# Patient Record
Sex: Male | Born: 1950 | Race: White | Hispanic: No | Marital: Married | Smoking: Current some day smoker
Health system: Southern US, Academic
[De-identification: ages and names within clinical notes are randomized; demographics above are authoritative.]

## PROBLEM LIST (undated history)

## (undated) DIAGNOSIS — E785 Hyperlipidemia, unspecified: Secondary | ICD-10-CM

## (undated) DIAGNOSIS — I2510 Atherosclerotic heart disease of native coronary artery without angina pectoris: Secondary | ICD-10-CM

## (undated) DIAGNOSIS — I10 Essential (primary) hypertension: Secondary | ICD-10-CM

## (undated) HISTORY — DX: Essential (primary) hypertension: I10

## (undated) HISTORY — PX: HX CORONARY ARTERY BYPASS GRAFT: SHX141

## (undated) HISTORY — DX: Atherosclerotic heart disease of native coronary artery without angina pectoris: I25.10

## (undated) HISTORY — PX: HX BACK SURGERY: SHX140

## (undated) HISTORY — DX: Hyperlipidemia, unspecified: E78.5

## (undated) HISTORY — PX: CARDIAC CATHETERIZATION: SHX172

---

## 2019-11-14 ENCOUNTER — Inpatient Hospital Stay (HOSPITAL_COMMUNITY): Payer: Self-pay | Admitting: Internal Medicine

## 2020-11-21 IMAGING — MR MRI CERVICAL SPINE WITHOUT CONTRAST
6 series · 28 of 48 positions shown · IV contrast (gadolinium)
Comparison: None available.

﻿EXAM:  MRI CERVICAL SPINE WITHOUT CONTRAST
INDICATION: Neck pain with left upper extremity numbness.
TECHNIQUE: Multiplanar multisequential MRI of the cervical spine was performed without gadolinium contrast.

[Series 4: s-map · sagittal · 8.8mm · 4.38mm/px · 8 of 100 slices shown]
[im 4/100]
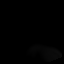
[im 16/100]
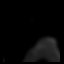
[im 32/100]
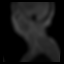
[im 44/100]
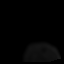
[im 56/100]
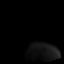
[im 68/100]
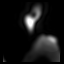
[im 84/100]
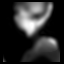
[im 96/100]
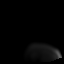

[Series 5: T2 · sagittal · 3.0mm · 0.81mm/px · 4 of 15 slices shown (1 of 2)]
[im 1/15]
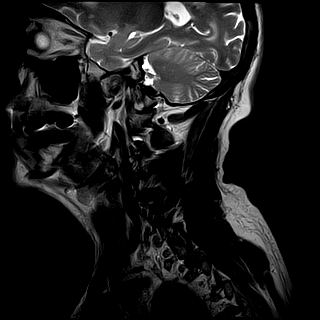
[im 5/15]
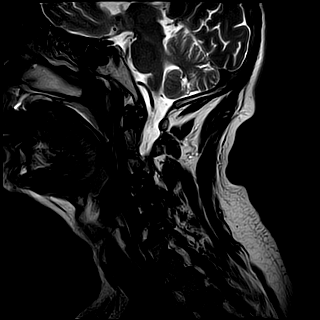
[im 10/15]
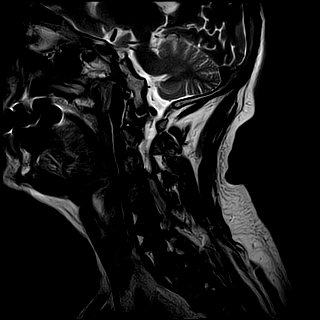
[im 15/15]
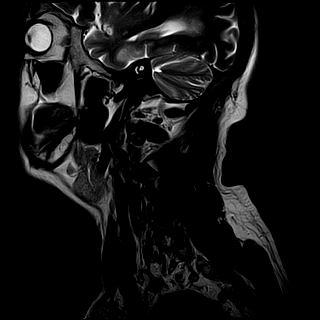

[Series 6: T1 · sagittal · 3.0mm · 0.47mm/px · 4 of 15 slices shown]
[im 1/15]
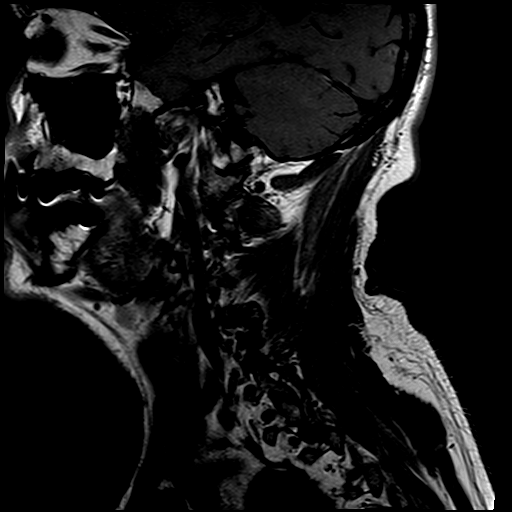
[im 5/15]
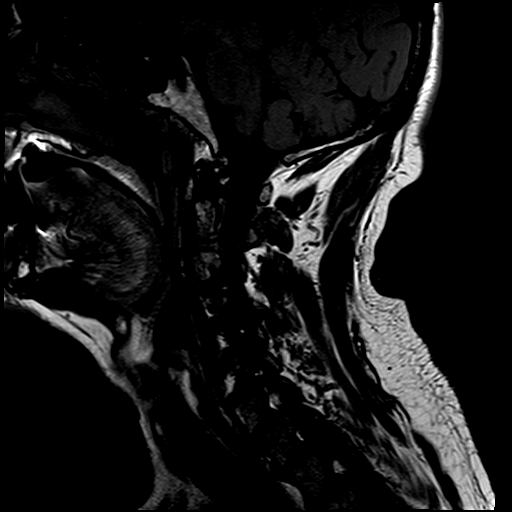
[im 10/15]
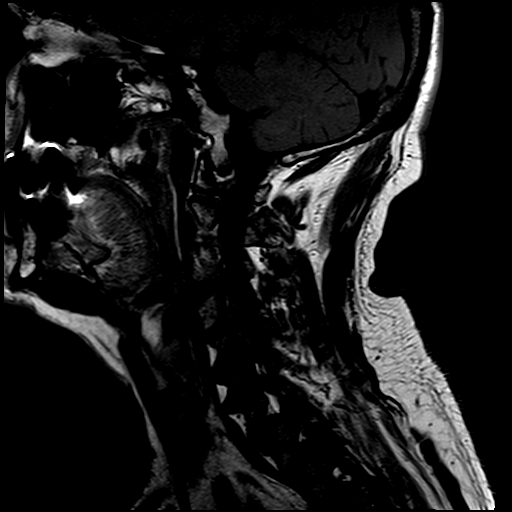
[im 15/15]
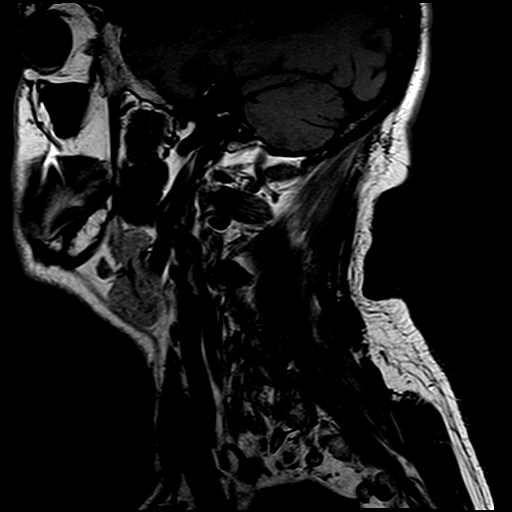

[Series 7: STIR · sagittal · 3.0mm · 0.51mm/px · 4 of 15 slices shown]
[im 1/15]
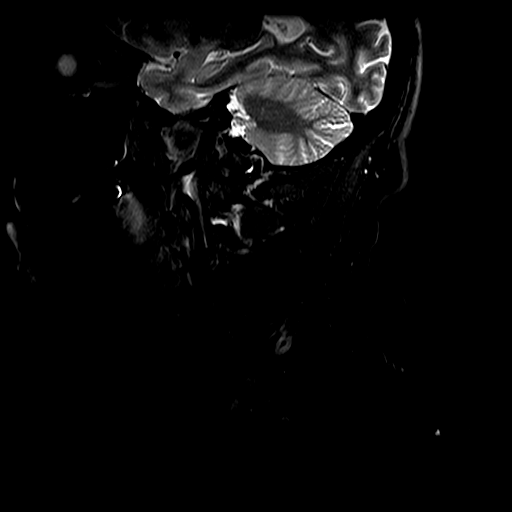
[im 5/15]
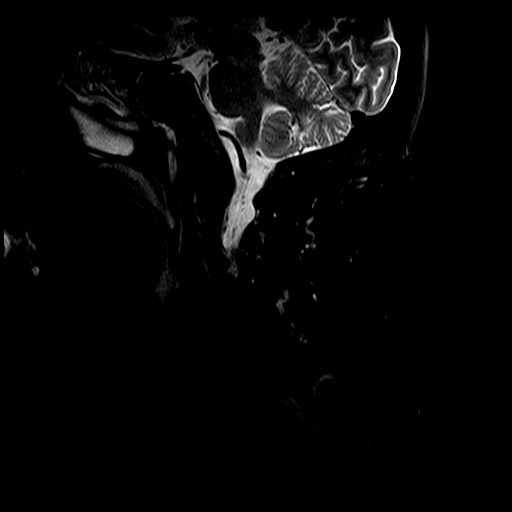
[im 10/15]
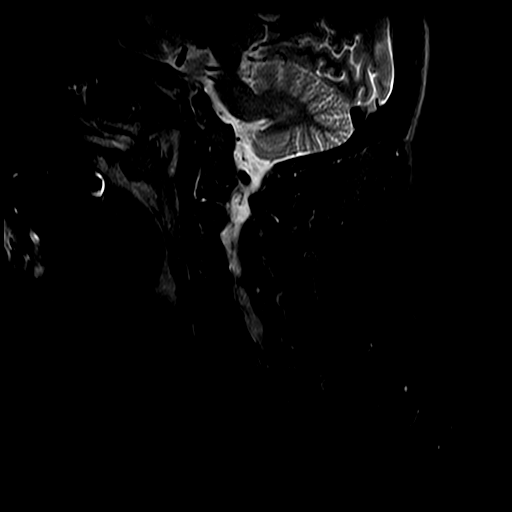
[im 15/15]
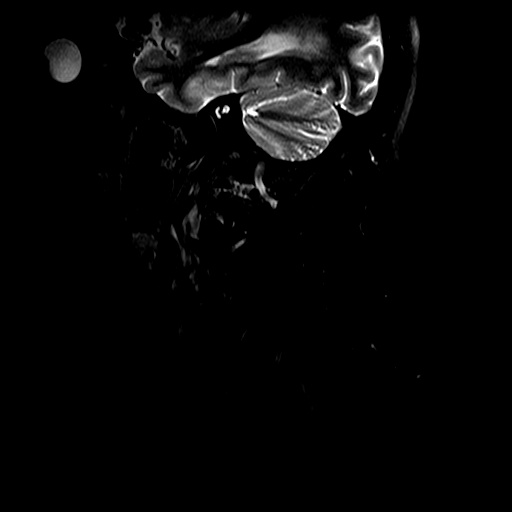

[Series 8: T2-star · axial · 3.0mm · 0.39mm/px · z∈[-127,-81]mm · 3 of 18 slices shown]
[im 1/18]
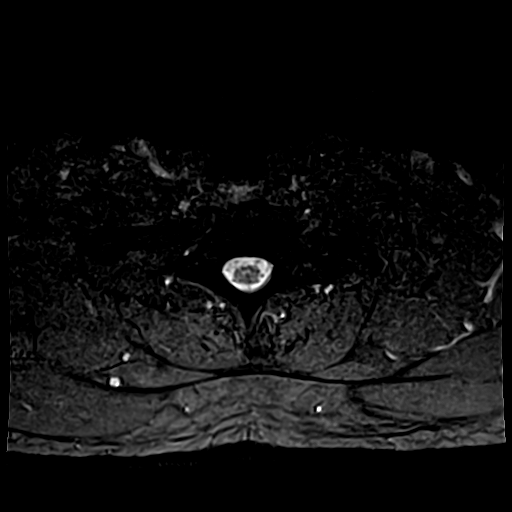
[im 5/18]
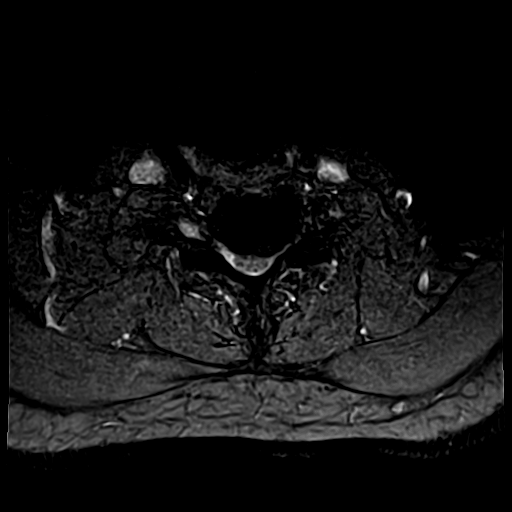
[im 9/18]
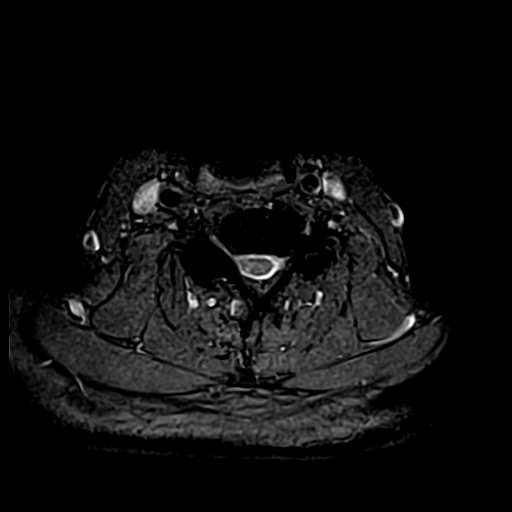

[Series 9: T2 · axial · 3.0mm · 0.39mm/px · z∈[-127,-17]mm · 5 of 17 slices shown (2 of 2)]
[im 1/17]
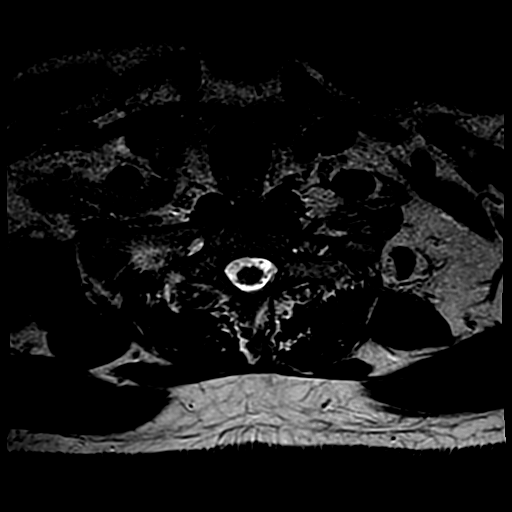
[im 5/17]
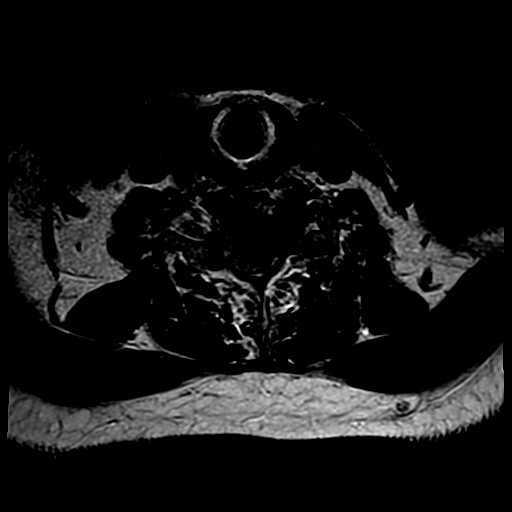
[im 9/17]
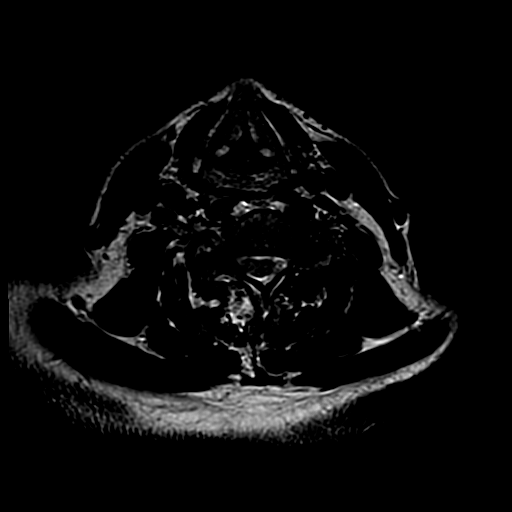
[im 13/17]
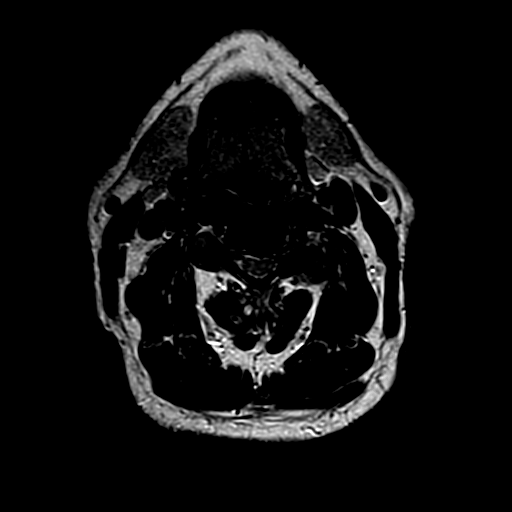
[im 17/17]
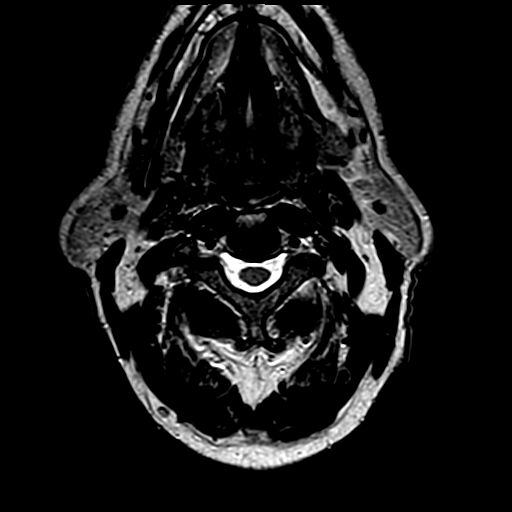

[28 of 48 positions shown; findings below may reference images not displayed]

FINDINGS: Bone marrow signal intensity is normal. There is no acute fracture or subluxation. Visualized spinal cord is normal in signal intensity without evidence of compression at any level.

C2-3 level is unremarkable.

At C3-4 level, there is a small broad-based central disc bulge resulting in mild spinal stenosis. There is severe bilateral neural foraminal stenosis from facet and uncovertebral joint hypertrophy.

At C4-5 level, there is minimal anterolisthesis of C4 on C5 vertebral body. There is a small broad-based central disc bulge with near complete effacement of the ventral CSF. There is severe bilateral neural foraminal stenosis from facet and uncovertebral joint hypertrophy.

At C5-6 level, there is a small broad-based central disc bulge with near complete effacement of the ventral CSF. There is severe bilateral neural foraminal stenosis from facet and uncovertebral joint hypertrophy.

At C6-7 level, there is moderate to marked disc desiccation. There is a small broad-based central disc osteophyte complex with near complete effacement of the ventral CSF. There is severe left and moderate right neural foraminal stenosis from facet and uncovertebral joint hypertrophy.

C7-T1 level and paraspinal soft tissues are unremarkable.
IMPRESSION: 1. Minimal anterolisthesis of C4 on C5 vertebral body. 

2. Small disc osteophyte complexes at most levels with mild spinal stenosis at C3-4 level. 

3. Multilevel neural foraminal stenosis as detailed above.

## 2020-11-28 IMAGING — MR MRI BRAIN WITHOUT CONTRAST
8 of 9 series · 37 of 48 positions shown · non-contrast
Comparison: None available.

﻿EXAM:  MRI BRAIN WITHOUT CONTRAST
INDICATION: Left-sided weakness. History of meningitis.
TECHNIQUE: Noncontrast multiplanar multisequential MRI of the brain was performed.

[Series 5: DWI · axial · 5.0mm · 1.35mm/px · z∈[-15,+105]mm · 10 of 88 slices shown (1 of 3)]
[im 6/88]
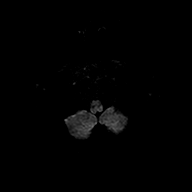
[im 12/88]
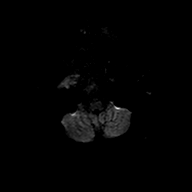
[im 18/88]
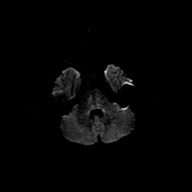
[im 30/88]
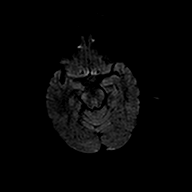
[im 41/88]
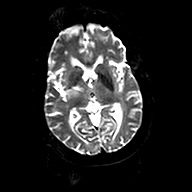
[im 47/88]
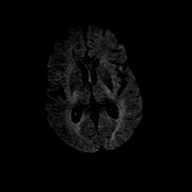
[im 53/88]
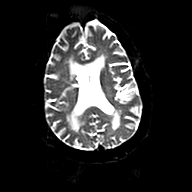
[im 64/88]
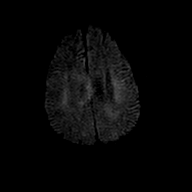
[im 76/88]
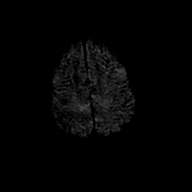
[im 88/88]
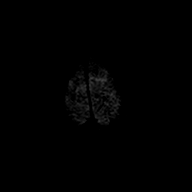

[Series 6: DWI · axial · 5.0mm · 1.35mm/px · z∈[-21,+105]mm · 5 of 22 slices shown (2 of 3)]
[im 1/22]
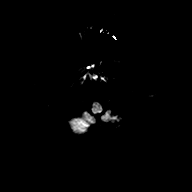
[im 6/22]
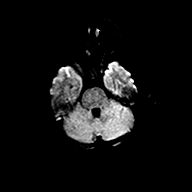
[im 11/22]
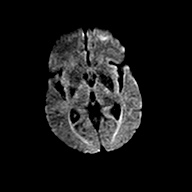
[im 16/22]
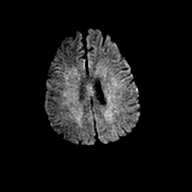
[im 22/22]
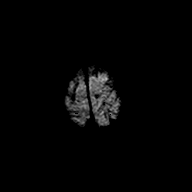

[Series 7: DWI · axial · 5.0mm · 1.35mm/px · z∈[-21,+105]mm · 3 of 21 slices shown (3 of 3)]
[im 1/21]
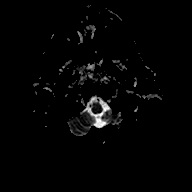
[im 11/21]
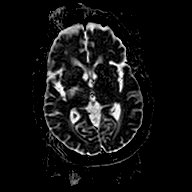
[im 21/21]
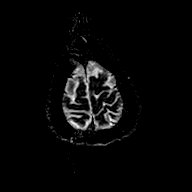

[Series 8: FLAIR · sagittal · 4.0mm · 0.75mm/px · 4 of 26 slices shown (1 of 2)]
[im 1/26]
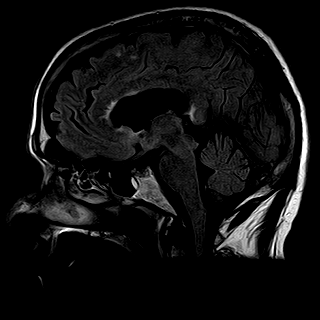
[im 9/26]
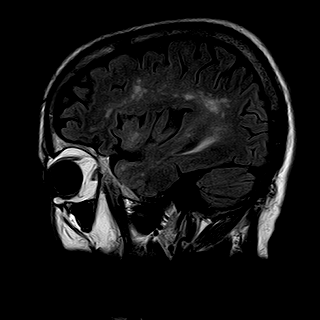
[im 17/26]
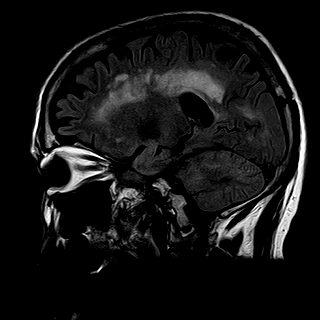
[im 26/26]
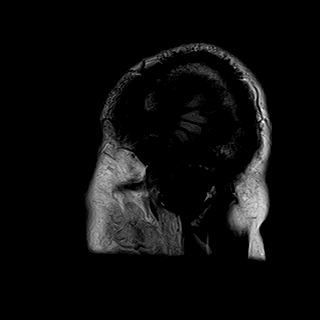

[Series 9: T2 · axial · 5.0mm · 0.43mm/px · z∈[-35,+121]mm · 4 of 27 slices shown (1 of 2)]
[im 1/27]
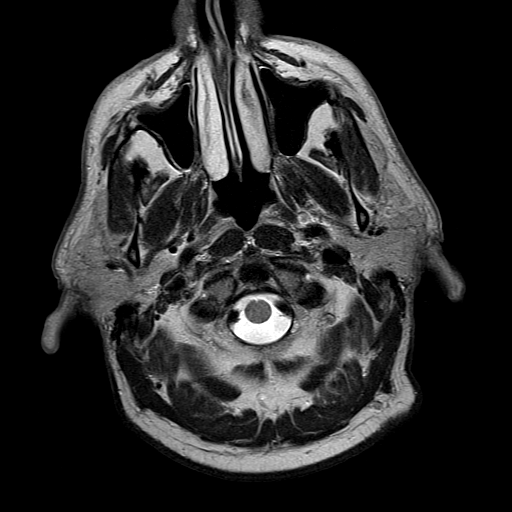
[im 9/27]
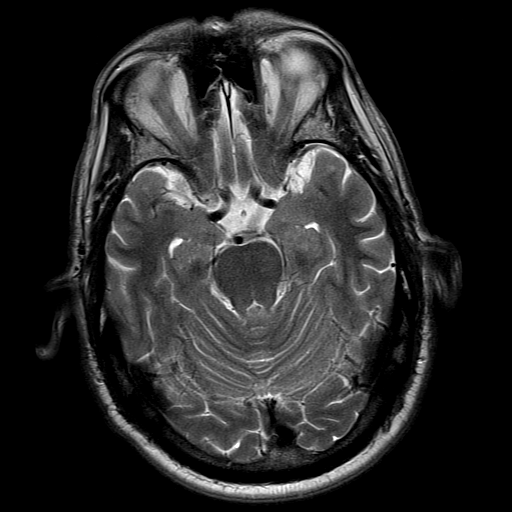
[im 18/27]
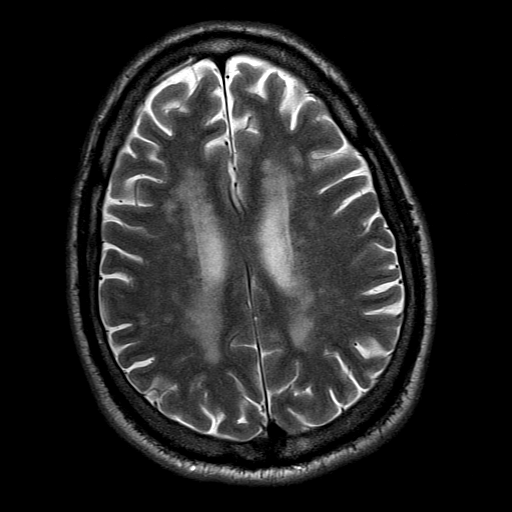
[im 27/27]
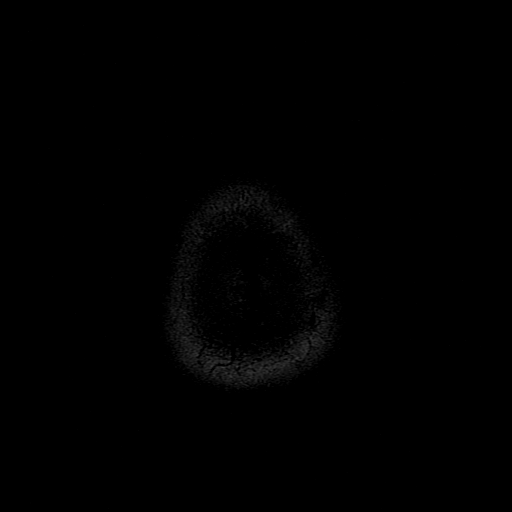

[Series 10: FLAIR · axial · 5.0mm · 0.43mm/px · z∈[-35,+121]mm · 4 of 27 slices shown (2 of 2)]
[im 1/27]
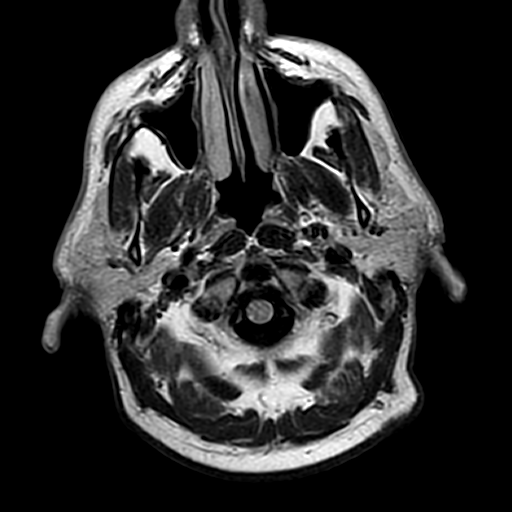
[im 9/27]
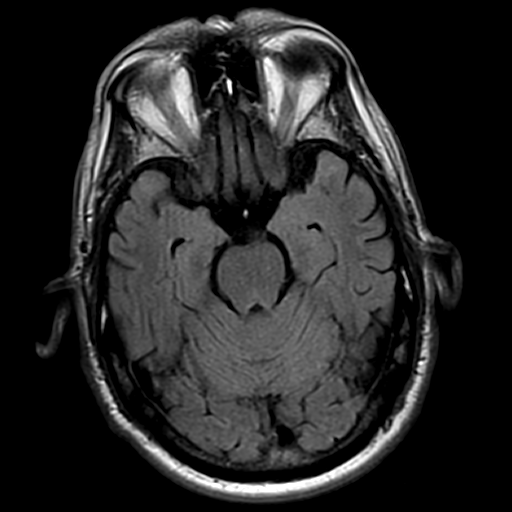
[im 18/27]
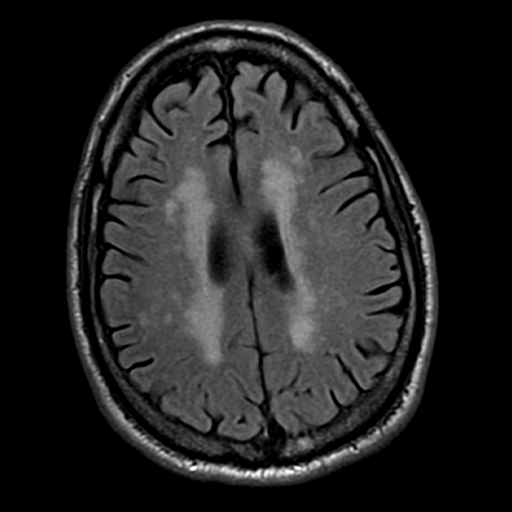
[im 27/27]
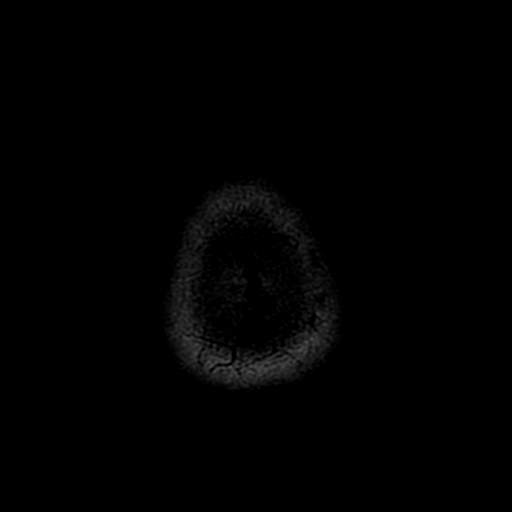

[Series 11: T2 · coronal · 6.0mm · 0.43mm/px · 4 of 24 slices shown (2 of 2)]
[im 1/24]
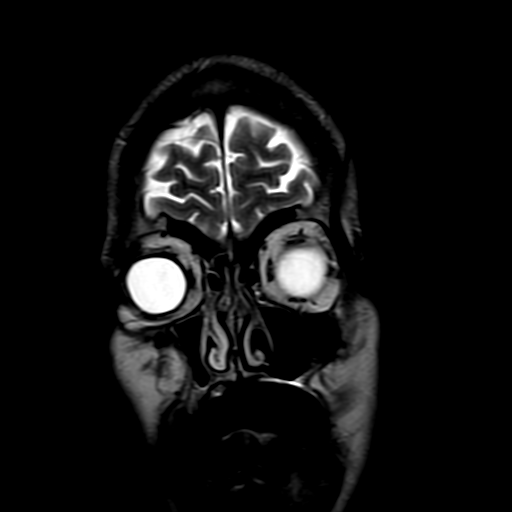
[im 8/24]
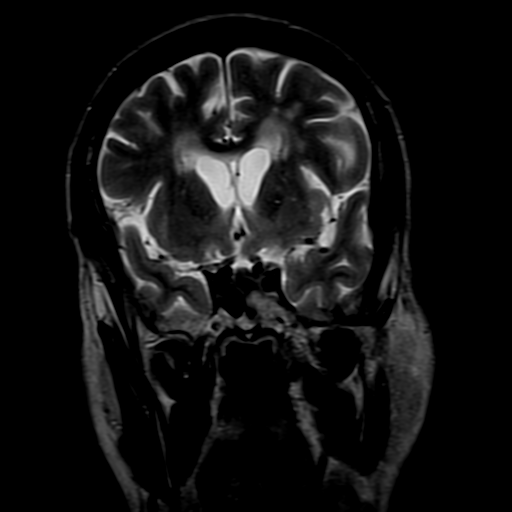
[im 16/24]
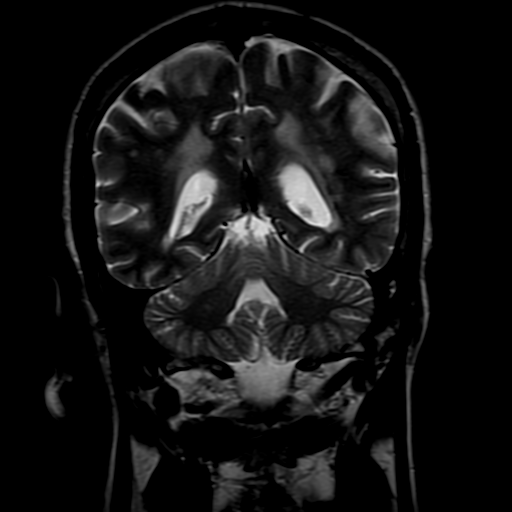
[im 24/24]
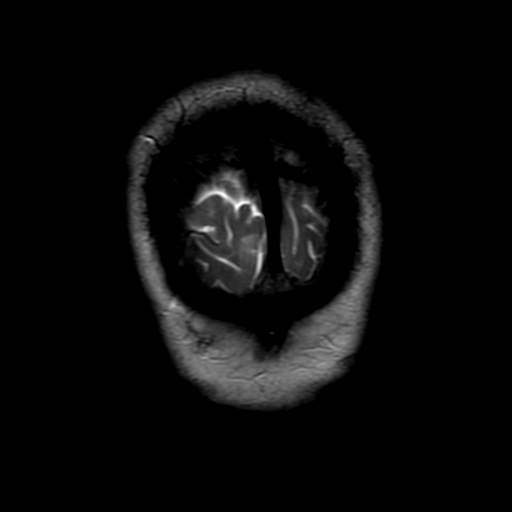

[Series 13: T1 · axial · 5.0mm · 0.43mm/px · z∈[-35,+67]mm · 3 of 27 slices shown]
[im 1/27]
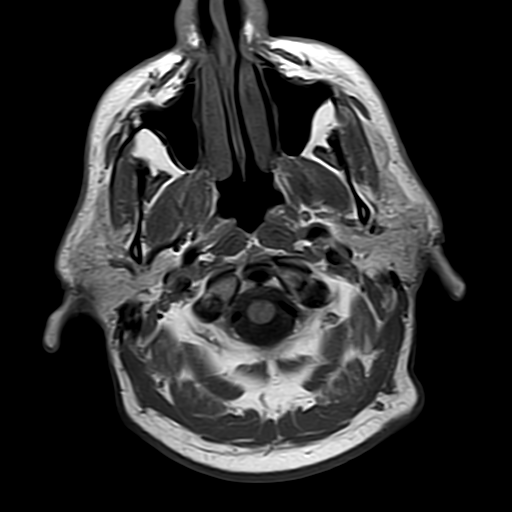
[im 9/27]
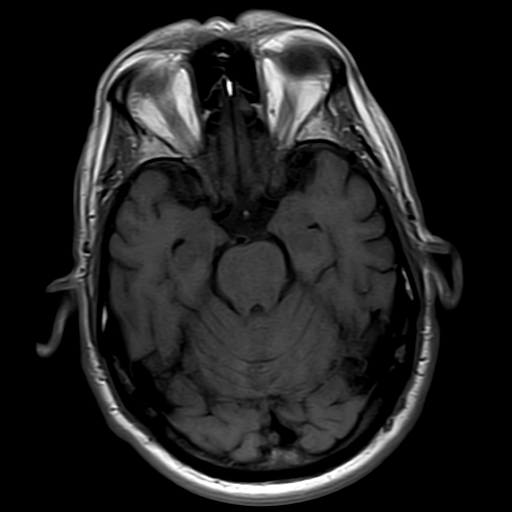
[im 18/27]
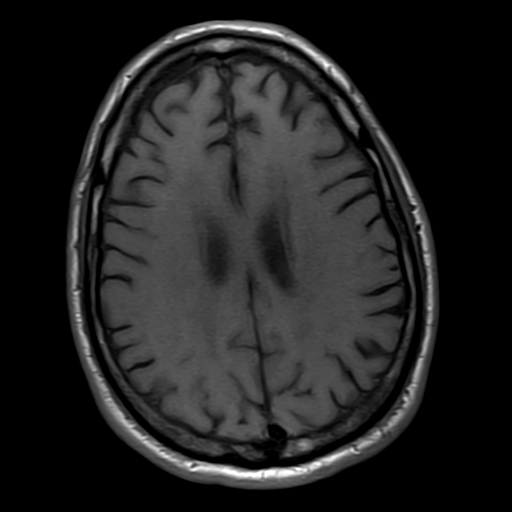

[37 of 48 positions shown; findings below may reference images not displayed]

FINDINGS: Ventricular and sulcal size is normal for the patient's age. There are moderate chronic small vessel ischemic changes. There is no mass effect, midline shift or intracranial hemorrhage. There is no evidence of acute infarction or prior microhemorrhages. Skull base flow voids and basal cisterns are patent. Sagittal survey of midline structures is unremarkable. There are no extra-axial fluid collections. Visualized paranasal sinuses, mastoid air cells and orbital contents are unremarkable.
IMPRESSION: Moderate chronic small vessel ischemic changes, no acute intracranial abnormality.

## 2020-11-28 IMAGING — MR MRI LUMBAR SPINE W/CONTRAST
9 series · 48 of 48 positions shown · IV contrast (gadavist)
Comparison: None available.

﻿EXAM:  MRI LUMBAR SPINE W/CONTRAST
INDICATION: Lower back pain with left lower extremity weakness. History of remote back surgery.
TECHNIQUE: Multiplanar multisequential MRI of the lumbar spine was performed without and with 5 mL of Gadavist.

[Series 11: T2 · sagittal · 4.0mm · 0.94mm/px · 5 of 13 slices shown (1 of 3)]
[im 1/13]
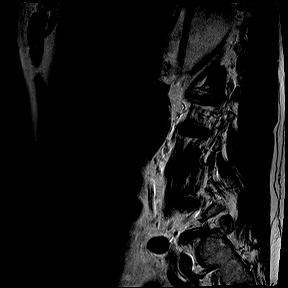
[im 4/13]
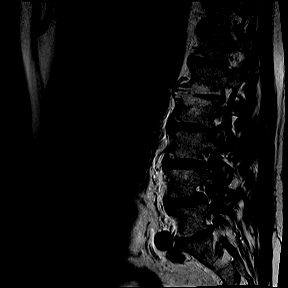
[im 7/13]
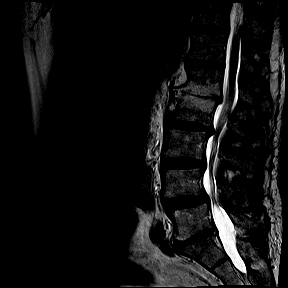
[im 10/13]
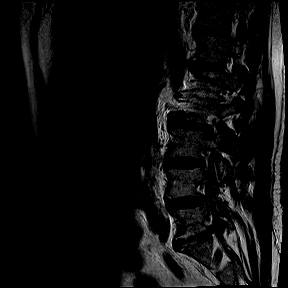
[im 13/13]
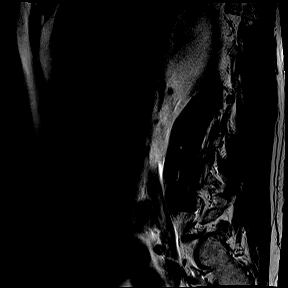

[Series 12: T1 · sagittal · 4.0mm · 0.94mm/px · 4 of 13 slices shown]
[im 1/13]
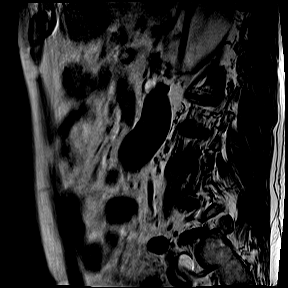
[im 5/13]
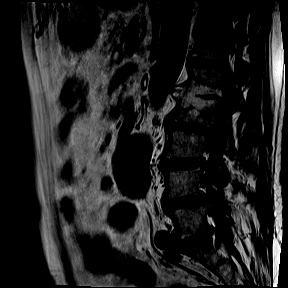
[im 9/13]
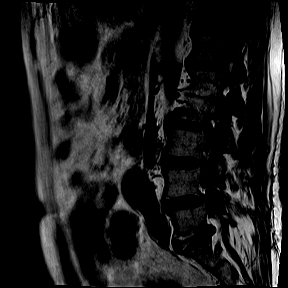
[im 13/13]
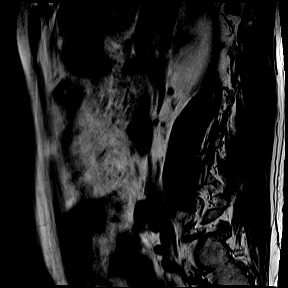

[Series 14: T1 fat-sat · sagittal · 4.0mm · 1.05mm/px · 4 of 13 slices shown (1 of 2)]
[im 1/13]
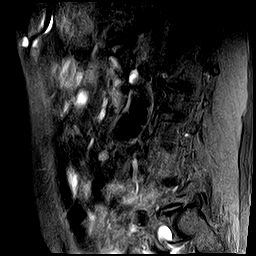
[im 5/13]
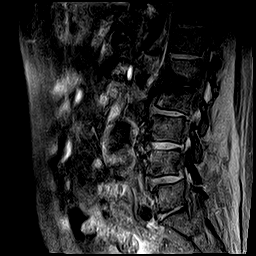
[im 9/13]
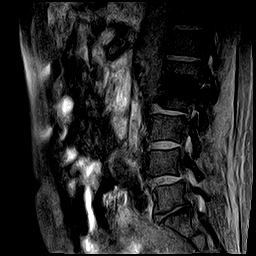
[im 13/13]
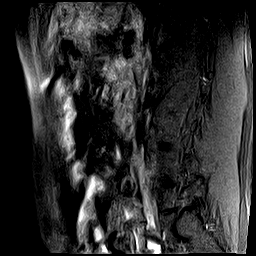

[Series 15: STIR · sagittal · 4.0mm · 1.05mm/px · 4 of 13 slices shown]
[im 1/13]
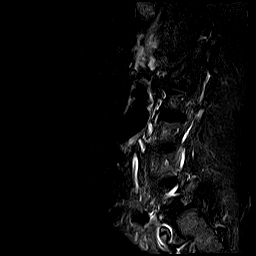
[im 5/13]
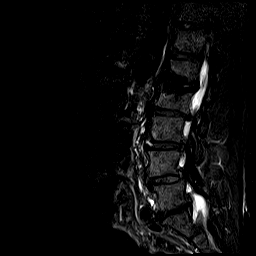
[im 9/13]
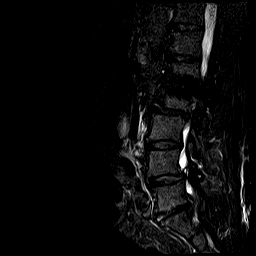
[im 13/13]
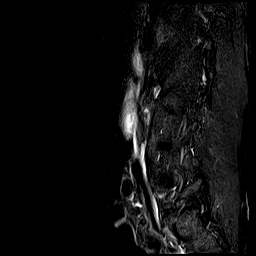

[Series 16: T2 · axial · 4.0mm · 0.47mm/px · z∈[-117,+65]mm · 7 of 23 slices shown (2 of 3)]
[im 1/23]
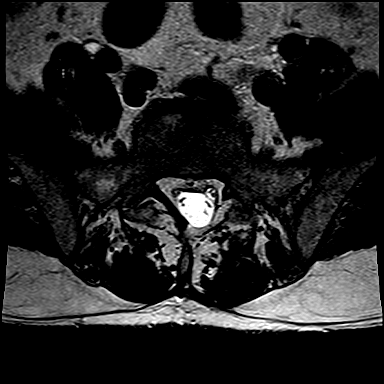
[im 4/23]
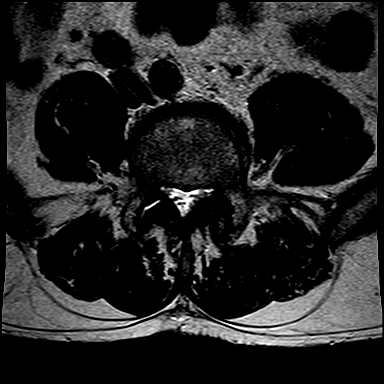
[im 8/23]
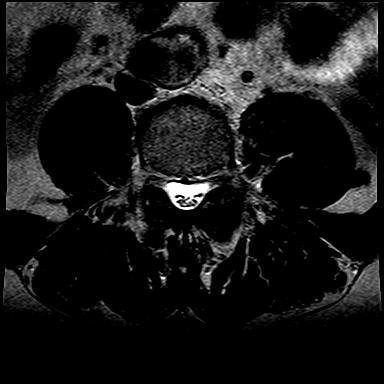
[im 12/23]
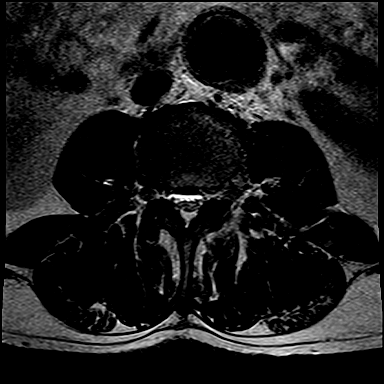
[im 15/23]
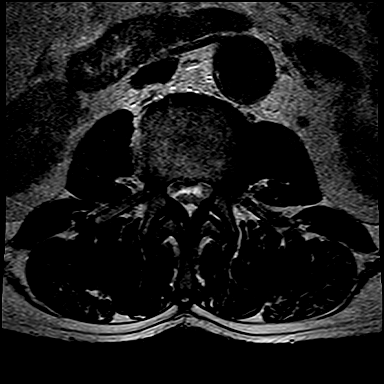
[im 19/23]
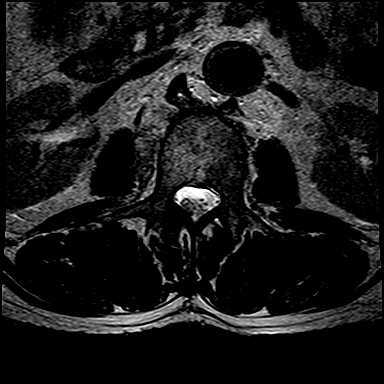
[im 23/23]
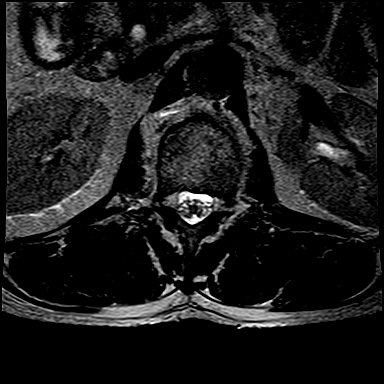

[Series 17: T1 fat-sat · axial · 4.0mm · 0.70mm/px · z∈[-117,+65]mm · 7 of 23 slices shown (2 of 2)]
[im 1/23]
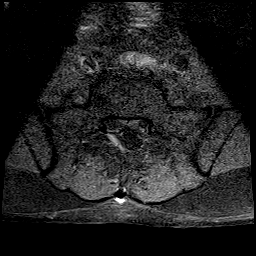
[im 4/23]
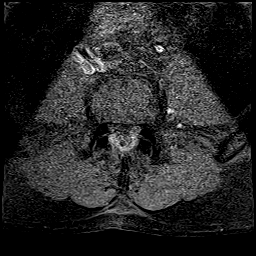
[im 8/23]
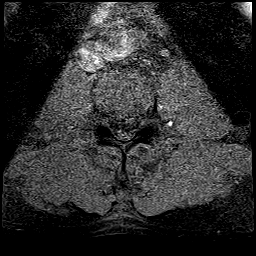
[im 12/23]
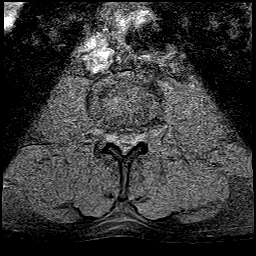
[im 15/23]
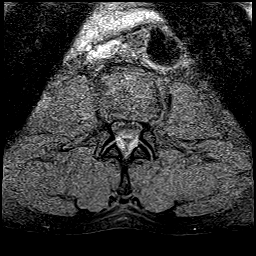
[im 19/23]
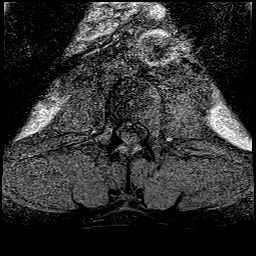
[im 23/23]
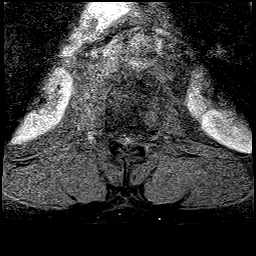

[Series 18: T1 fat-sat post-contrast · sagittal · 4.0mm · 1.05mm/px · 4 of 13 slices shown (1 of 2)]
[im 1/13]
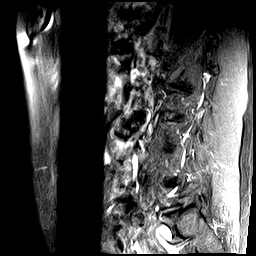
[im 5/13]
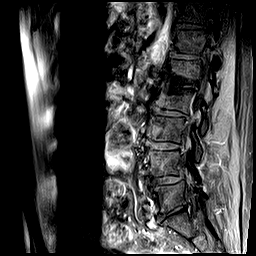
[im 9/13]
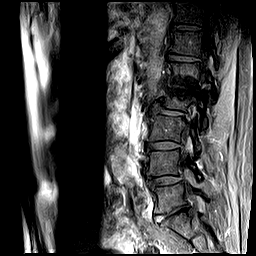
[im 13/13]
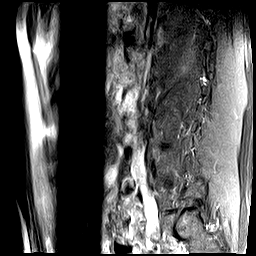

[Series 19: T1 fat-sat post-contrast · axial · 4.0mm · 0.70mm/px · z∈[-117,+65]mm · 7 of 23 slices shown (2 of 2)]
[im 1/23]
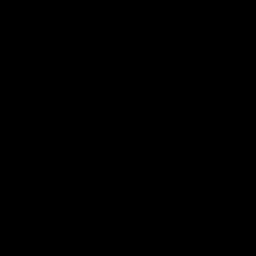
[im 4/23]
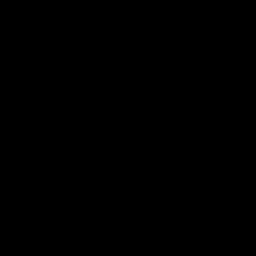
[im 8/23]
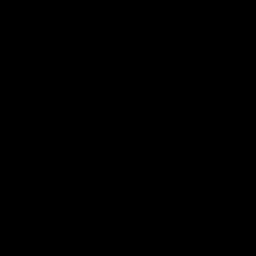
[im 12/23]
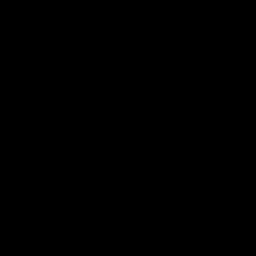
[im 15/23]
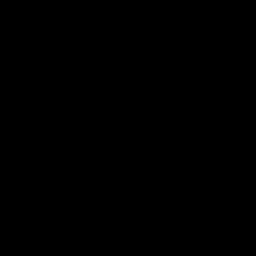
[im 19/23]
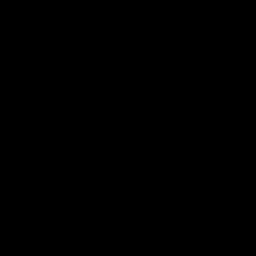
[im 23/23]
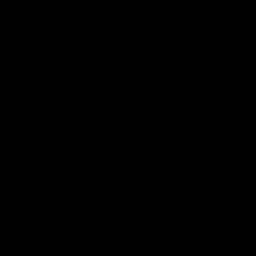

[Series 20: T2 · coronal · 4.0mm · 1.22mm/px · 6 of 20 slices shown (3 of 3)]
[im 1/20]
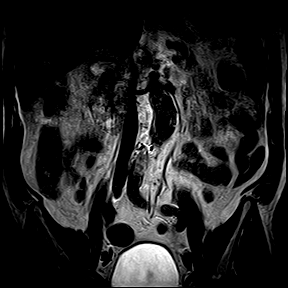
[im 4/20]
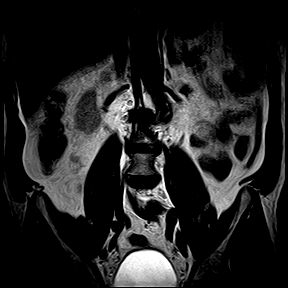
[im 8/20]
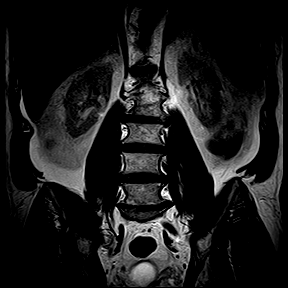
[im 12/20]
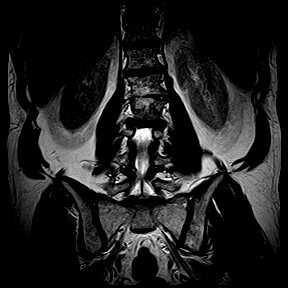
[im 16/20]
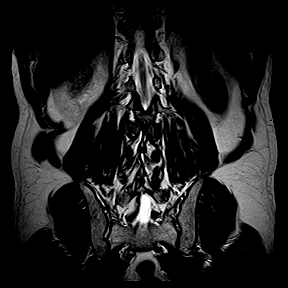
[im 20/20]
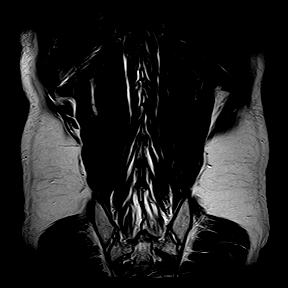

[48 of 48 positions shown; findings below may reference images not displayed]

FINDINGS: Bone marrow signal intensity is normal. There is no acute fracture or subluxation. Distal spinal cord is normal in signal intensity and terminates normally at T12-L1 disc space level. Spinal canal is congenitally narrow.

At L1-2 level, there is marked disc desiccation. Modic type 2 changes are also noted within the inferior endplate of L1 and superior endplate of L2 vertebral body. There is a small broad-based central and right paracentral disc bulge resulting in mild spinal and severe right lateral recess compression. There is mild-to-moderate bilateral neural foraminal stenosis from bulging annulus.

At L2-3 level, there is a small broad-based central disc bulge resulting in severe spinal stenosis. There is moderate bilateral neural foraminal stenosis from facet arthropathy and bulging annulus.

At L3-4 level, there is a small broad-based central disc bulge resulting in moderate spinal stenosis. There is moderate bilateral neural foraminal stenosis from facet arthropathy and bulging annulus.

At L4-5 level, there is minimal anterolisthesis of L4 on L5 vertebral body. There is a small broad-based central disc bulge resulting in moderate to severe spinal stenosis. There is moderate bilateral neural foraminal stenosis from facet arthropathy and bulging annulus.

At L5-S1 level, there is moderate to marked disc desiccation. There is a small broad-based central disc bulge, mildly effacing the ventral thecal sac. There is moderate to severe bilateral neural foraminal stenosis from facet arthropathy and bulging annulus.

Paraspinal soft tissues are unremarkable. Following intravenous contrast administration, there is no abnormal nerve root or paraspinal soft tissue enhancement. A 4.2 cm mid abdominal aortic aneurysm is incidentally identified.
IMPRESSION: 1. Minimal anterolisthesis of L4 on L5 vertebral body. 

2. Mild spinal and severe right lateral recess compression at L1-2 level from a small central and right paracentral disc bulge. 

3. Severe spinal stenosis at L2-3 level, moderate to severe spinal stenosis at L4-5 level and moderate spinal stenosis at L3-4 level from small central disc bulges. 

4. Multilevel neural foraminal stenosis as detailed above. Incidentally noted 4.2 cm mid abdominal aortic aneurysm.

## 2022-09-11 ENCOUNTER — Encounter (INDEPENDENT_AMBULATORY_CARE_PROVIDER_SITE_OTHER): Payer: Self-pay | Admitting: Physician Assistant

## 2022-09-17 ENCOUNTER — Encounter (INDEPENDENT_AMBULATORY_CARE_PROVIDER_SITE_OTHER): Payer: Self-pay | Admitting: CARDIOVASCULAR DISEASE

## 2022-09-18 ENCOUNTER — Other Ambulatory Visit (INDEPENDENT_AMBULATORY_CARE_PROVIDER_SITE_OTHER): Payer: Self-pay | Admitting: CARDIOVASCULAR DISEASE

## 2022-09-18 ENCOUNTER — Other Ambulatory Visit: Payer: Self-pay

## 2022-09-18 ENCOUNTER — Ambulatory Visit (INDEPENDENT_AMBULATORY_CARE_PROVIDER_SITE_OTHER): Payer: Medicare Other | Admitting: CARDIOVASCULAR DISEASE

## 2022-09-18 ENCOUNTER — Encounter (INDEPENDENT_AMBULATORY_CARE_PROVIDER_SITE_OTHER): Payer: Self-pay | Admitting: CARDIOVASCULAR DISEASE

## 2022-09-18 VITALS — BP 130/70 | HR 58 | Ht 69.0 in | Wt 195.3 lb

## 2022-09-18 DIAGNOSIS — E785 Hyperlipidemia, unspecified: Secondary | ICD-10-CM

## 2022-09-18 DIAGNOSIS — I2510 Atherosclerotic heart disease of native coronary artery without angina pectoris: Secondary | ICD-10-CM

## 2022-09-18 DIAGNOSIS — R9431 Abnormal electrocardiogram [ECG] [EKG]: Secondary | ICD-10-CM

## 2022-09-18 DIAGNOSIS — I10 Essential (primary) hypertension: Secondary | ICD-10-CM

## 2022-09-18 DIAGNOSIS — F17200 Nicotine dependence, unspecified, uncomplicated: Secondary | ICD-10-CM

## 2022-09-18 MED ORDER — REPATHA SURECLICK 140 MG/ML SUBCUTANEOUS PEN INJECTOR
1.0000 mL | PEN_INJECTOR | SUBCUTANEOUS | 3 refills | Status: DC
Start: 2022-09-18 — End: 2022-10-16

## 2022-09-18 NOTE — Addendum Note (Signed)
Addended by: Einar Gip on: 09/18/2022 02:02 PM     Modules accepted: Orders

## 2022-09-18 NOTE — Progress Notes (Signed)
CARDIOLOGY [facility]  [address] [place] [address]       Name: Christian Reyes MRN:  [id number]   Date: 09/18/2022 Age: 71 y.o.       Follow up Progress Note  PCP: No primary care provider on file.    HPI:  Tyrail Reyes is a 71 y.o. year old male who presents to the clinic for  coronary artery disease.    CARDIAC HISTORY:  This is a 71 years old patient who has a history of coronary artery bypass surgery in the past.  The last left ventricular ejection fraction was approximately around 35-40% in May of 2019.  The patient has a history of left internal mammary artery bypass graft to the left anterior descending coronary artery, saphenous vein bypass graft to the diagonal branch and a saphenous vein bypass graft to the 2nd diagonal branch and a saphenous vein bypass graft to the 3rd diagonal branch and a saphenous vein bypass graft to the distal right coronary artery.  The patient can not take Lipitor fenofibrate and Crestor.  He was on Repatha which was stopped because of severe pain in both knee joints.  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  He has not taken any Repatha for more than a year because of significant knee pain.  The patient continues to smoke about half a pack of cigarettes a day under drinks on a regular basis.  He has not physically active.    Not on File  Social History     Tobacco Use   Smoking Status Not on file   Smokeless Tobacco Not on file           Current Medical History:  I have comprehensively reviewed the records of their past medical history, family history, social history, along with medication history and they are updated per HPI.    Review of Systems:  General: No fever, No chills  Neck: No pain or swelling  HEENT: No dizziness  Cardiac: As noted in HPI  Pulmonary: As noted in HPI  Abdomen: No pain or change in bowel  GU:  No  hematuria    Physical Exam:   There were no vitals taken for this visit.      General: Normal.  Skin: Normal.  Eyes: Normal.  HENT: Normal.  Respiratory: Normal.  Cardiovascular: Normal  first and second heart sounds there is a grade 1/6 systolic murmur over the aortic area.  GI: Normal.  Extremities:  No edema of the lower extremities.   Musculoskeletal:  Normal.  Neurologic:  Normal.  Psychiatric: Normal.    Wt Readings from Last 3 Encounters:   No data found for Wt       Current Medications:   Current Outpatient Medications   Medication Sig Dispense Refill    aspirin (ECOTRIN) 81 mg Oral Tablet, Delayed Release (E.C.) Take 1 Tablet (81 mg total) by mouth Once a day      carvediloL (COREG) 6.25 mg Oral Tablet Take 1 Tablet (6.25 mg total) by mouth Twice daily with food      evolocumab (REPATHA SURECLICK) 140 mg/mL Subcutaneous Pen Injector Inject under the skin      losartan (COZAAR) 25 mg Oral Tablet Take 1 Tablet (25 mg total) by mouth Once a day      omeprazole (PRILOSEC) 40 mg Oral Capsule, Delayed Release(E.C.)  Take 1 Capsule (40 mg total) by mouth Once a day       No current facility-administered medications for this visit.       Current Labs:   His labs done by the primary physician shows that the LDL cholesterol is not well controlled.  EKGs done preoperatively shows sinus rhythm T-wave inversions in lateral precordial leads and intermittent left bundle branch block pattern.    Orders:  No orders of the defined types were placed in this encounter.    There are no discontinued medications.    ASSESSMENT/PLAN:   No diagnosis found.    1.  Coronary artery disease.  The patient has an abnormal EKG and needs preoperative clearance.  He has not had any stress test after the bypass surgery and he has been noncompliant with cholesterol medication under he continues to smoke.  His EKG shows sinus rhythm and T-wave inversion in lateral precordial leads which may suggest ischemia and also intermittent left  bundle-branch block .  The patient is not physically active.  We will proceed with a nuclear stress test to see if there is any evidence of ischemia.    2.  Hyperlipidemia.  I have sent him a new prescription for Repatha.  I have told him to take it on a regular basis.    3. Persistent tobacco use.  Spoke to the patient at length about tobacco cessation.  Follow-up in 6 months if the stress test is negative.  If the stress test is negative I will clear him as intermediate risk for cardiac complications during the proposed back surgery.    Follow Up:    No follow-ups on file.     Zenaida Deed, MD,09/18/2022,13:20    [facility]  [facility]iology   [facility]    A portion of this documentation may have been generated using [facility] voice recognition software and may contain syntax/voice recognition errors.

## 2022-09-26 ENCOUNTER — Encounter (HOSPITAL_BASED_OUTPATIENT_CLINIC_OR_DEPARTMENT_OTHER): Payer: Medicare Other

## 2022-09-26 DIAGNOSIS — I447 Left bundle-branch block, unspecified: Secondary | ICD-10-CM

## 2022-09-26 DIAGNOSIS — Z01810 Encounter for preprocedural cardiovascular examination: Secondary | ICD-10-CM

## 2022-10-01 ENCOUNTER — Telehealth (INDEPENDENT_AMBULATORY_CARE_PROVIDER_SITE_OTHER): Payer: Self-pay | Admitting: CARDIOVASCULAR DISEASE

## 2022-10-01 NOTE — Telephone Encounter (Signed)
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  He was already cleared by Maddie PA for his back surgery - mild to moderate risk.  Sharon Seller, RN

## 2022-10-16 ENCOUNTER — Telehealth (INDEPENDENT_AMBULATORY_CARE_PROVIDER_SITE_OTHER): Payer: Self-pay | Admitting: CARDIOVASCULAR DISEASE

## 2022-10-16 ENCOUNTER — Other Ambulatory Visit (INDEPENDENT_AMBULATORY_CARE_PROVIDER_SITE_OTHER): Payer: Self-pay | Admitting: CARDIOVASCULAR DISEASE

## 2022-10-16 MED ORDER — REPATHA SURECLICK 140 MG/ML SUBCUTANEOUS PEN INJECTOR: 1.0000 mL | PEN_INJECTOR | SUBCUTANEOUS | 3 refills | Status: DC

## 2022-10-16 NOTE — Telephone Encounter (Signed)
Called patient to let him know that his Reptha did get approve until next year. Patient's daughter answered and was wondering if i could send a prescription in for it since he is almost out.     [place], [place]

## 2022-10-21 ENCOUNTER — Encounter (INDEPENDENT_AMBULATORY_CARE_PROVIDER_SITE_OTHER): Payer: Medicare Other | Admitting: CARDIOVASCULAR DISEASE

## 2022-12-29 NOTE — Progress Notes (Unsigned)
[place]  [place] [address]       Name: Christian Reyes MRN:  [id number]   Date: 12/30/2022 Age: 72 y.o.       Follow up Progress Note  PCP: Lady Deutscher, PA-C    HPI:  Christian Reyes is a 72 y.o. year old male who presents to the clinic for  coronary artery disease    CARDIAC HISTORY: This is a 72 years old patient who has a history of coronary artery bypass surgery in the past.  The last left ventricular ejection fraction was approximately around 35-40% in May of 2019.  The patient has a history of left internal mammary artery bypass graft to the left anterior descending coronary artery, saphenous vein bypass graft to the diagonal branch and a saphenous vein bypass graft to the 2nd diagonal branch and a saphenous vein bypass graft to the 3rd diagonal branch and a saphenous vein bypass graft to the distal right coronary artery.  The patient can not take Lipitor fenofibrate and Crestor.  He was on Repatha which was stopped because of severe pain in both knee joints.  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  He has not taken any Repatha for more than a year because of significant knee pain.  The patient was smoking about half a pack of cigarettes a day under drinks on a regular basis.  The patient quit smoking completely now.  Also he is taking the cholesterol injections on a regular basis.  He is become more physically active now than before.  He had the back surgery without any complications.       No Known Allergies  Social History     Tobacco Use   Smoking Status Every Day    Current packs/day: 0.50    Types: Cigarettes   Smokeless Tobacco Never           Current Medical History:  I have comprehensively reviewed the records of their past medical history, family history, social history, along with medication history and they are updated per  HPI.    Review of Systems:  General: No fever, No chills  Neck: No pain or swelling  HEENT: No dizziness  Cardiac: As noted in HPI  Pulmonary: As noted in HPI  Abdomen: No pain or change in bowel  GU:  No hematuria    Physical Exam:   There were no vitals taken for this visit.      General: Normal.  Skin: Normal.  Eyes: Normal.  HENT: Normal.  Respiratory: Normal.  Cardiovascular: Normal.  GI: Normal.  Extremities:  No edema of the lower extremities.   Musculoskeletal:  Normal.  Neurologic:  Normal.  Psychiatric: Normal.    Wt Readings from Last 3 Encounters:   09/18/22 88.6 kg (195 lb 4.8 oz)       Current Medications:   Current Outpatient Medications   Medication Sig Dispense Refill    aspirin (ECOTRIN) 81 mg Oral Tablet, Delayed Release (E.C.) Take 1 Tablet (81 mg total) by mouth Once a day      carvediloL (COREG) 6.25 mg Oral Tablet Take 1 Tablet (6.25 mg total) by mouth Twice daily with food      evolocumab (REPATHA SURECLICK) [id number] mg/mL Subcutaneous Pen Injector Inject 1 mL (140 mg total) under the skin Every 14 days 6 Each 3  losartan (COZAAR) 25 mg Oral Tablet Take 1 Tablet (25 mg total) by mouth Once a day      omeprazole (PRILOSEC) 40 mg Oral Capsule, Delayed Release(E.C.) Take 20 mg by mouth Once a day       No current facility-administered medications for this visit.       Current Labs:   His previous labs showed significantly elevated LDL cholesterol of 122.  HDL was 24 triglyceride 346 and total cholesterol was 107 under these labs were done in October of 2023.  Repeat labs are pending.    Orders:  No orders of the defined types were placed in this encounter.    There are no discontinued medications.    ASSESSMENT/PLAN:   No diagnosis found.    1.  Coronary artery disease.  The patient is not having any symptoms.  The patient quit smoking and drinking and he is taking the cholesterol medication on a regular basis.  He had a negative stress test in the past.  Continue current medications.    2.   Hyperlipidemia will request labs at this time.    3. Tobacco abuse.  The patient quit smoking completely.  Follow-up in 6 months.      Follow Up:    No follow-ups on file.     Einar Gip, MD,12/29/2022,08:02    [facility]    A portion of this documentation may have been generated using [facility] voice recognition software and may contain syntax/voice recognition errors.

## 2022-12-30 ENCOUNTER — Ambulatory Visit (INDEPENDENT_AMBULATORY_CARE_PROVIDER_SITE_OTHER): Payer: Medicare Other | Admitting: CARDIOVASCULAR DISEASE

## 2022-12-30 ENCOUNTER — Encounter (INDEPENDENT_AMBULATORY_CARE_PROVIDER_SITE_OTHER): Payer: Self-pay | Admitting: CARDIOVASCULAR DISEASE

## 2022-12-30 ENCOUNTER — Other Ambulatory Visit: Payer: Self-pay

## 2022-12-30 VITALS — BP 136/73 | HR 61 | Temp 96.5°F | Resp 16 | Ht 70.0 in | Wt 185.8 lb

## 2022-12-30 DIAGNOSIS — E785 Hyperlipidemia, unspecified: Secondary | ICD-10-CM

## 2022-12-30 DIAGNOSIS — Z87891 Personal history of nicotine dependence: Secondary | ICD-10-CM

## 2022-12-30 DIAGNOSIS — I2510 Atherosclerotic heart disease of native coronary artery without angina pectoris: Secondary | ICD-10-CM

## 2022-12-30 DIAGNOSIS — I10 Essential (primary) hypertension: Secondary | ICD-10-CM

## 2023-09-21 ENCOUNTER — Encounter (HOSPITAL_BASED_OUTPATIENT_CLINIC_OR_DEPARTMENT_OTHER): Payer: Self-pay | Admitting: PHYSICIAN ASSISTANT

## 2023-10-20 ENCOUNTER — Other Ambulatory Visit: Payer: Self-pay

## 2023-10-20 ENCOUNTER — Ambulatory Visit: Payer: Medicare Other | Attending: CARDIOVASCULAR DISEASE | Admitting: CARDIOVASCULAR DISEASE

## 2023-10-20 ENCOUNTER — Encounter (INDEPENDENT_AMBULATORY_CARE_PROVIDER_SITE_OTHER): Payer: Self-pay | Admitting: CARDIOVASCULAR DISEASE

## 2023-10-20 VITALS — BP 179/96 | HR 63 | Temp 98.1°F | Resp 16 | Ht 71.0 in | Wt 199.0 lb

## 2023-10-20 DIAGNOSIS — I2510 Atherosclerotic heart disease of native coronary artery without angina pectoris: Secondary | ICD-10-CM | POA: Insufficient documentation

## 2023-10-20 DIAGNOSIS — I10 Essential (primary) hypertension: Secondary | ICD-10-CM | POA: Insufficient documentation

## 2023-10-20 DIAGNOSIS — E785 Hyperlipidemia, unspecified: Secondary | ICD-10-CM | POA: Insufficient documentation

## 2023-10-20 MED ORDER — METOPROLOL SUCCINATE ER 25 MG TABLET,EXTENDED RELEASE 24 HR: 12.5000 mg | ORAL_TABLET | Freq: Every day | ORAL | 3 refills | Status: DC

## 2023-10-20 MED ORDER — PLAVIX 75 MG TABLET
75.0000 mg | ORAL_TABLET | Freq: Every day | ORAL | 3 refills | Status: AC
Start: 2023-10-20 — End: ?

## 2023-10-20 MED ORDER — FENOFIBRATE NANOCRYSTALLIZED 48 MG TABLET: 48.0000 mg | ORAL_TABLET | Freq: Every morning | ORAL | 3 refills | Status: DC

## 2023-10-20 NOTE — Progress Notes (Signed)
CARDIOLOGY [facility] MEDICAL OFFICE [facility]ILD[facility]  [address]  [place] [address]  Operated by [facility]     Name: Christian Reyes MRN:  [id number]   Date: 10/20/2023 Age: 73 y.o.       Follow up Progress Note  PCP: Creed Copper, PA-C    HPI:  Christian Reyes is a 73 y.o. year old male who presents to the clinic for  follow-up visit.    CARDIAC HISTORY: This is a 73 year old patient  came in for follow-up visit.  The patient's last echocardiogram showed an ejection fraction of approximately 35-40% in May of 2019.  The patient has a history of left internal mammary artery bypass graft to left anterior descending coronary artery, saphenous vein bypass graft to the diagonal branch and a saphenous vein bypass graft to the 2nd diagonal branch and a saphenous vein bypass graft to the 3rd diagonal branch and a saphenous vein bypass graft to the distal right coronary artery.  The patient is having allergic reactions to statins under has been on Repatha intermittently the patient quit smoking recently.  The patient also drinks on a regular basis    No Known Allergies  Social History     Tobacco Use   Smoking Status Some Days    Current packs/day: 0.50    Types: Cigarettes   Smokeless Tobacco Never           Current Medical History:  I have comprehensively reviewed the records of their past medical history, family history, social history, along with medication history and they are updated per HPI.    Review of Systems:  General: No fever, No chills  Neck: No pain or swelling  HEENT: No dizziness  Cardiac: As noted in HPI  Pulmonary: As noted in HPI  Abdomen: No pain or change in bowel  GU:  No hematuria    Physical Exam:   BP (!) 179/96 (Site: Left Arm, Patient Position: Sitting, Cuff Size: Adult)   Pulse 63   Temp 36.7 C (98.1 F) (Temporal)   Resp 16   Ht 1.803 m (5\' 11" )   Wt 90.3 kg (199 lb)   SpO2 99%   BMI 27.75 kg/m       General: Normal.  Skin: Normal.  Eyes:  Normal.  HENT: Normal.  Respiratory: Normal.  Cardiovascular: Normal.  GI: Normal.  Extremities:  No edema of the lower extremities.   Musculoskeletal:  Normal.  Neurologic:  Normal.  Psychiatric: Normal.    Wt Readings from Last 3 Encounters:   10/20/23 90.3 kg (199 lb)   12/30/22 84.3 kg (185 lb 13.6 oz)   09/18/22 88.6 kg (195 lb 4.8 oz)       Current Medications:   Current Outpatient Medications   Medication Sig Dispense Refill    aspirin (ECOTRIN) 81 mg Oral Tablet, Delayed Release (E.C.) Take 1 Tablet (81 mg total) by mouth Once a day      evolocumab (REPATHA SURECLICK) 140 mg/mL Subcutaneous Pen Injector Inject 1 mL (140 mg total) under the skin Every 14 days 6 Each 3    Ibuprofen (MOTRIN) 800 mg Oral Tablet Take 1 Tablet (800 mg total) by mouth Three times a day as needed      losartan (COZAAR) 25 mg Oral Tablet Take 1 Tablet (25 mg total) by mouth Once a day Every other day      omeprazole (PRILOSEC) 40 mg Oral Capsule, Delayed Release(E.C.) Take 20 mg by mouth  Once a day      PLAVIX 75 mg Oral Tablet Take 1 Tablet (75 mg total) by mouth Once a day       No current facility-administered medications for this visit.       Current Labs:   Followed by primary physician.  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  Labs during this admission shows a BUN of 17 creatinine of 1.1 serum potassium is 4.3.  Hemoglobin A1c is 5.8.  TSH is 2.  LDL cholesterol is 8.  Total cholesterol is 80.  Triglyceride is 305.    Orders:  No orders of the defined types were placed in this encounter.    Medications Discontinued During This Encounter   Medication Reason    carvediloL (COREG) 6.25 mg Oral Tablet De-escalation of therapy       ASSESSMENT/PLAN:   Assessment/Plan   1. CAD in native artery    2. Essential hypertension    3. Hyperlipidemia with target low density lipoprotein (LDL) cholesterol less  than 70 mg/dL        1.  Coronary artery disease.  The patient is not having any angina.  He is not having any evidence of congestive heart failure.  His left ventricular systolic function is low.  He will be considered as intermediate risk for cardiac complications during the proposed left carotid angioplasty.    2.  Hyperlipidemia on medical therapy triglycerides are elevated.  He is on Repatha and he is taking it.  I will add fenofibrate 48 mg once a day.  The labs will have to be followed with the primary physician and I explained to him about this.    3. Hypertension the patient has elevated blood pressure and has a heart rate running 45-60 beats per minute.  In view of this I will decrease the Coreg from 6.25 mg once a day to metoprolol 12.5 mg extended release once a day.  The patient will have to check his heart rate and blood pressure and call us back so that we can adjust his medications.  If he can not tolerate beta-blocker because of bradycardia we will have to switch over to amlodipine.  If not we can continue beta-blockers at a lower dose.    4. The patient continues to smoke a few cigarettes a day and I strongly advised him to quit smoking.  The patient is unable to come every 6 months and I will see him in 1 year time unless he has got a cardiac issue.  This was explained to the patient in detail.    Follow Up:    Return in about 1 year (around 10/19/2024).     Zenaida Deed, MD,10/20/2023,16:21    [facility]  Cardiology   [facility] Medicine    A portion of this documentation may have been generated using [facility] voice recognition software and may contain syntax/voice recognition errors.

## 2023-10-21 ENCOUNTER — Encounter (INDEPENDENT_AMBULATORY_CARE_PROVIDER_SITE_OTHER): Payer: Self-pay | Admitting: CARDIOVASCULAR DISEASE

## 2023-12-10 ENCOUNTER — Encounter (INDEPENDENT_AMBULATORY_CARE_PROVIDER_SITE_OTHER): Payer: Self-pay | Admitting: CARDIOVASCULAR DISEASE

## 2024-10-19 ENCOUNTER — Other Ambulatory Visit: Payer: Self-pay

## 2024-10-19 ENCOUNTER — Ambulatory Visit: Payer: Self-pay | Attending: CARDIOVASCULAR DISEASE | Admitting: CARDIOVASCULAR DISEASE

## 2024-10-19 ENCOUNTER — Encounter (INDEPENDENT_AMBULATORY_CARE_PROVIDER_SITE_OTHER): Payer: Self-pay | Admitting: CARDIOVASCULAR DISEASE

## 2024-10-19 VITALS — BP 142/87 | HR 67 | Temp 97.7°F | Resp 17 | Ht 70.0 in | Wt 193.0 lb

## 2024-10-19 DIAGNOSIS — N1831 Chronic kidney disease, stage 3a: Secondary | ICD-10-CM | POA: Insufficient documentation

## 2024-10-19 DIAGNOSIS — I10 Essential (primary) hypertension: Secondary | ICD-10-CM | POA: Insufficient documentation

## 2024-10-19 DIAGNOSIS — I25811 Atherosclerosis of native coronary artery of transplanted heart without angina pectoris: Secondary | ICD-10-CM | POA: Insufficient documentation

## 2024-10-19 DIAGNOSIS — E785 Hyperlipidemia, unspecified: Secondary | ICD-10-CM | POA: Insufficient documentation

## 2024-10-19 MED ORDER — REPATHA SURECLICK 140 MG/ML SUBCUTANEOUS PEN INJECTOR: 1.0000 mL | PEN_INJECTOR | SUBCUTANEOUS | 3 refills | Status: AC

## 2024-10-19 MED ORDER — LOSARTAN 25 MG TABLET: 25.0000 mg | ORAL_TABLET | Freq: Two times a day (BID) | ORAL | 3 refills | Status: AC

## 2024-10-19 MED ORDER — METOPROLOL SUCCINATE ER 25 MG TABLET,EXTENDED RELEASE 24 HR: 12.5000 mg | ORAL_TABLET | Freq: Every day | ORAL | 3 refills | Status: AC

## 2024-10-19 MED ORDER — LOSARTAN 25 MG TABLET: 25.0000 mg | ORAL_TABLET | Freq: Every day | ORAL | 3 refills | Status: DC

## 2024-10-19 MED ORDER — FENOFIBRATE NANOCRYSTALLIZED 48 MG TABLET: 48.0000 mg | ORAL_TABLET | Freq: Every morning | ORAL | 3 refills | Status: AC

## 2024-10-19 NOTE — Progress Notes (Addendum)
 CARDIOLOGY [facility], MEDICAL OFFICE [facility]  [address]  [place] [address]  Operated by [facility]     Name: Christian Reyes MRN:  [id number]   Date: 10/19/2024 Age: 74 y.o.       Follow up Progress Note  PCP: Willam Ned, PA-C    HPI:  Christian Reyes is a 74 y.o. year old male who presents to the clinic for  follow-up visit.    CARDIAC HISTORY: This has a 74 year old gentleman who came in for follow-up visit.  He has a history of previous coronary artery bypass surgery with a LIMA graft to left anterior descending coronary artery, saphenous vein bypass graft to the diagonal branch and a saphenous vein bypass graft to the 2nd diagonal branch and a saphenous vein bypass graft to the 3rd diagonal branch and a saphenous vein bypass graft to the distal right coronary artery.  The patient can not tolerate in his statins and he was on Repatha  but he takes it irregularly.  His LDL cholesterol recently was 140 mg%.  His liver enzymes were normal.  His creatinine is 1.3 which is elevated.  He quit drinking completely.  He denies any chest pain or shortness of breath he had a negative nuclear stress test several months ago.    Allergies[1]  Social History     Tobacco Use   Smoking Status Some Days    Current packs/day: 0.50    Types: Cigarettes   Smokeless Tobacco Never           Current Medical History:  I have comprehensively reviewed the records of their past medical history, family history, social history, along with medication history and they are updated per HPI.    Review of Systems:  General: No fever, No chills  Neck: No pain or swelling  HEENT: No dizziness  Cardiac: As noted in HPI  Pulmonary: As noted in HPI  Abdomen: No pain or change in bowel  GU:  No hematuria    Physical Exam:   BP (!) 142/87 (Site: Left Arm, Patient Position: Sitting, Cuff Size: Adult)   Pulse 67   Temp 36.5 C (97.7 F) (Temporal)   Resp 17   Ht 1.778 m (5' 10)   Wt 87.5 kg (193 lb)    SpO2 97%   BMI 27.69 kg/m       General: Normal.  Skin: Normal.  Eyes: Normal.  HENT: Normal.  Respiratory: Normal.  Cardiovascular: Normal.  GI: Normal.  Extremities:  No edema of the lower extremities.   Musculoskeletal:  Normal.  Neurologic:  Normal.  Psychiatric: Normal.    Wt Readings from Last 3 Encounters:   10/19/24 87.5 kg (193 lb)   10/20/23 90.3 kg (199 lb)   12/30/22 84.3 kg (185 lb 13.6 oz)       Current Medications:   Current Outpatient Medications   Medication Sig Dispense Refill    aspirin (ECOTRIN) 81 mg Oral Tablet, Delayed Release (E.C.) Take 1 Tablet (81 mg total) by mouth Once a day      evolocumab  (REPATHA  SURECLICK) 140 mg/mL Subcutaneous Pen Injector Inject 1 mL (140 mg total) under the skin Every 14 days 6 Each 3    fenofibrate  nanocrystallized (TRICOR ) 48 mg Oral Tablet Take 1 Tablet (48 mg total) by mouth Every morning with breakfast 90 Tablet 3    losartan  (COZAAR ) 25 mg Oral Tablet Take 1 Tablet (25 mg total) by mouth Once a day Every other  day      metoprolol  succinate (TOPROL -XL) 25 mg Oral Tablet Sustained Release 24 hr Take 0.5 Tablets (12.5 mg total) by mouth Once a day Indications: myocardial reinfarction prevention 45 Tablet 3    PLAVIX  75 mg Oral Tablet Take 1 Tablet (75 mg total) by mouth Once a day Indications: treatment to prevent a heart attack 90 Tablet 3    predniSONE (DELTASONE) 10 mg Oral Tablet Take 0.5 Tablets (5 mg total) by mouth Daily      traMADoL (ULTRAM) 50 mg Oral Tablet Take 1-2 Tablets (50-100 mg total) by mouth Every 4 hours as needed 25 mg x3 times a day       No current facility-administered medications for this visit.       Current Labs:   Labs reviewed.  LDL cholesterol is 140 mg%.    Orders:  No orders of the defined types were placed in this encounter.    Medications Discontinued During This Encounter   Medication Reason    Ibuprofen (MOTRIN) 800 mg Oral Tablet Patient states no longer taking       ASSESSMENT/PLAN:   Assessment/Plan   1. Coronary  artery disease involving native artery of transplanted heart without angina pectoris    2. Hyperlipidemia with target LDL less than 70    3. Hypertension, unspecified type    4. 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  Coronary artery disease.  The patient is not having any symptoms at this time.  Continue medical therapy.    2.  Hyperlipidemia can not tolerate statins.  He is not taking Repatha  regularly.  I told him to take it on a regular basis and repeat labs in about 3 months' time.    3. Hypertension under control.  Follow-up in 6 months.    Follow Up:    Return in about 6 months (around 04/18/2025).     Sonia Bromell, MD,10/19/2024,13:28    [facility]  Cardiology   [facility] Medicine    A portion of this documentation may have been generated using [facility] voice recognition software and may contain syntax/voice recognition errors.                            [1] No Known Allergies

## 2024-10-19 NOTE — Addendum Note (Signed)
 Addended by: Talina Pleitez on: 10/19/2024 01:41 PM     Modules accepted: Orders

## 2025-10-19 ENCOUNTER — Ambulatory Visit (INDEPENDENT_AMBULATORY_CARE_PROVIDER_SITE_OTHER): Payer: Self-pay | Admitting: CARDIOVASCULAR DISEASE
